# Patient Record
Sex: Male | Born: 1975 | Race: Black or African American | Hispanic: No | Marital: Married | Smoking: Never smoker
Health system: Southern US, Community
[De-identification: ages and names within clinical notes are randomized; demographics above are authoritative.]

## PROBLEM LIST (undated history)

## (undated) DIAGNOSIS — I10 Essential (primary) hypertension: Secondary | ICD-10-CM

## (undated) HISTORY — DX: Essential (primary) hypertension: I10

---

## 2012-05-12 ENCOUNTER — Telehealth: Payer: Self-pay | Admitting: Oncology

## 2012-05-12 ENCOUNTER — Encounter: Payer: Self-pay | Admitting: Oncology

## 2012-05-12 ENCOUNTER — Other Ambulatory Visit: Payer: Self-pay | Admitting: Oncology

## 2012-05-12 DIAGNOSIS — R790 Abnormal level of blood mineral: Secondary | ICD-10-CM

## 2012-05-12 NOTE — Telephone Encounter (Signed)
S/W PT WIFE IN REF TO NP APPT. 06/19/12@10 :30 REFERRING DR NNODI DX-R/O HEMACHROMATOSIS MAILED NP PACKET

## 2012-05-12 NOTE — Telephone Encounter (Signed)
C/D 05/12/12 for appt. 06/19/12

## 2012-06-16 NOTE — Patient Instructions (Signed)
1.  Issue:  Need to rule out hemachromatosis per primary care physician's request. 2.  Work up:  Blood test.  3.  If positive for hemachromatosis, treatment will be therapeutic phlebotomy to decrease iron load.  Goal is to decrease risk of end organ damage such as joint pain, heart failure, cirrhosis.

## 2012-06-19 ENCOUNTER — Ambulatory Visit: Payer: Self-pay

## 2012-06-19 ENCOUNTER — Other Ambulatory Visit: Payer: Self-pay | Admitting: Lab

## 2012-06-19 ENCOUNTER — Ambulatory Visit: Payer: Self-pay | Admitting: Oncology

## 2012-06-19 NOTE — Progress Notes (Signed)
No show.  Reminder letter sent.   

## 2012-10-02 ENCOUNTER — Other Ambulatory Visit: Payer: Self-pay

## 2012-10-02 ENCOUNTER — Other Ambulatory Visit: Payer: Self-pay | Admitting: Family Medicine

## 2012-10-02 DIAGNOSIS — M549 Dorsalgia, unspecified: Secondary | ICD-10-CM

## 2012-10-03 ENCOUNTER — Ambulatory Visit
Admission: RE | Admit: 2012-10-03 | Discharge: 2012-10-03 | Disposition: A | Discharge status: Home / Self Care | Payer: Managed Care, Other (non HMO) | Source: Ambulatory Visit | Attending: Family Medicine | Admitting: Family Medicine

## 2012-10-03 DIAGNOSIS — M549 Dorsalgia, unspecified: Secondary | ICD-10-CM

## 2013-04-22 ENCOUNTER — Ambulatory Visit (HOSPITAL_BASED_OUTPATIENT_CLINIC_OR_DEPARTMENT_OTHER)
Admission: RE | Admit: 2013-04-22 | Discharge: 2013-04-22 | Disposition: A | Discharge status: Home / Self Care | Payer: Managed Care, Other (non HMO) | Source: Ambulatory Visit | Attending: Family Medicine | Admitting: Family Medicine

## 2013-04-22 ENCOUNTER — Other Ambulatory Visit (HOSPITAL_BASED_OUTPATIENT_CLINIC_OR_DEPARTMENT_OTHER): Payer: Self-pay | Admitting: Family Medicine

## 2013-04-22 DIAGNOSIS — M25569 Pain in unspecified knee: Secondary | ICD-10-CM | POA: Insufficient documentation

## 2013-04-22 DIAGNOSIS — R52 Pain, unspecified: Secondary | ICD-10-CM

## 2013-09-17 ENCOUNTER — Ambulatory Visit
Admission: RE | Admit: 2013-09-17 | Discharge: 2013-09-17 | Disposition: A | Discharge status: Home / Self Care | Payer: 59 | Source: Ambulatory Visit | Attending: Family Medicine | Admitting: Family Medicine

## 2013-09-17 ENCOUNTER — Other Ambulatory Visit: Payer: Self-pay | Admitting: Family Medicine

## 2013-09-17 DIAGNOSIS — R079 Chest pain, unspecified: Secondary | ICD-10-CM

## 2014-06-18 ENCOUNTER — Other Ambulatory Visit: Payer: Self-pay | Admitting: Family Medicine

## 2014-06-18 DIAGNOSIS — N5089 Other specified disorders of the male genital organs: Secondary | ICD-10-CM

## 2014-06-21 ENCOUNTER — Ambulatory Visit
Admission: RE | Admit: 2014-06-21 | Discharge: 2014-06-21 | Disposition: A | Discharge status: Home / Self Care | Payer: Managed Care, Other (non HMO) | Source: Ambulatory Visit | Attending: Family Medicine | Admitting: Family Medicine

## 2014-06-21 DIAGNOSIS — N5089 Other specified disorders of the male genital organs: Secondary | ICD-10-CM

## 2015-12-23 IMAGING — CR DG CHEST 2V
2 series · 2 of 2 positions shown · non-contrast
Comparison: None.

CLINICAL DATA: Chest pain.

EXAM:
CHEST  2 VIEW

[view not recorded (1 of 2)]
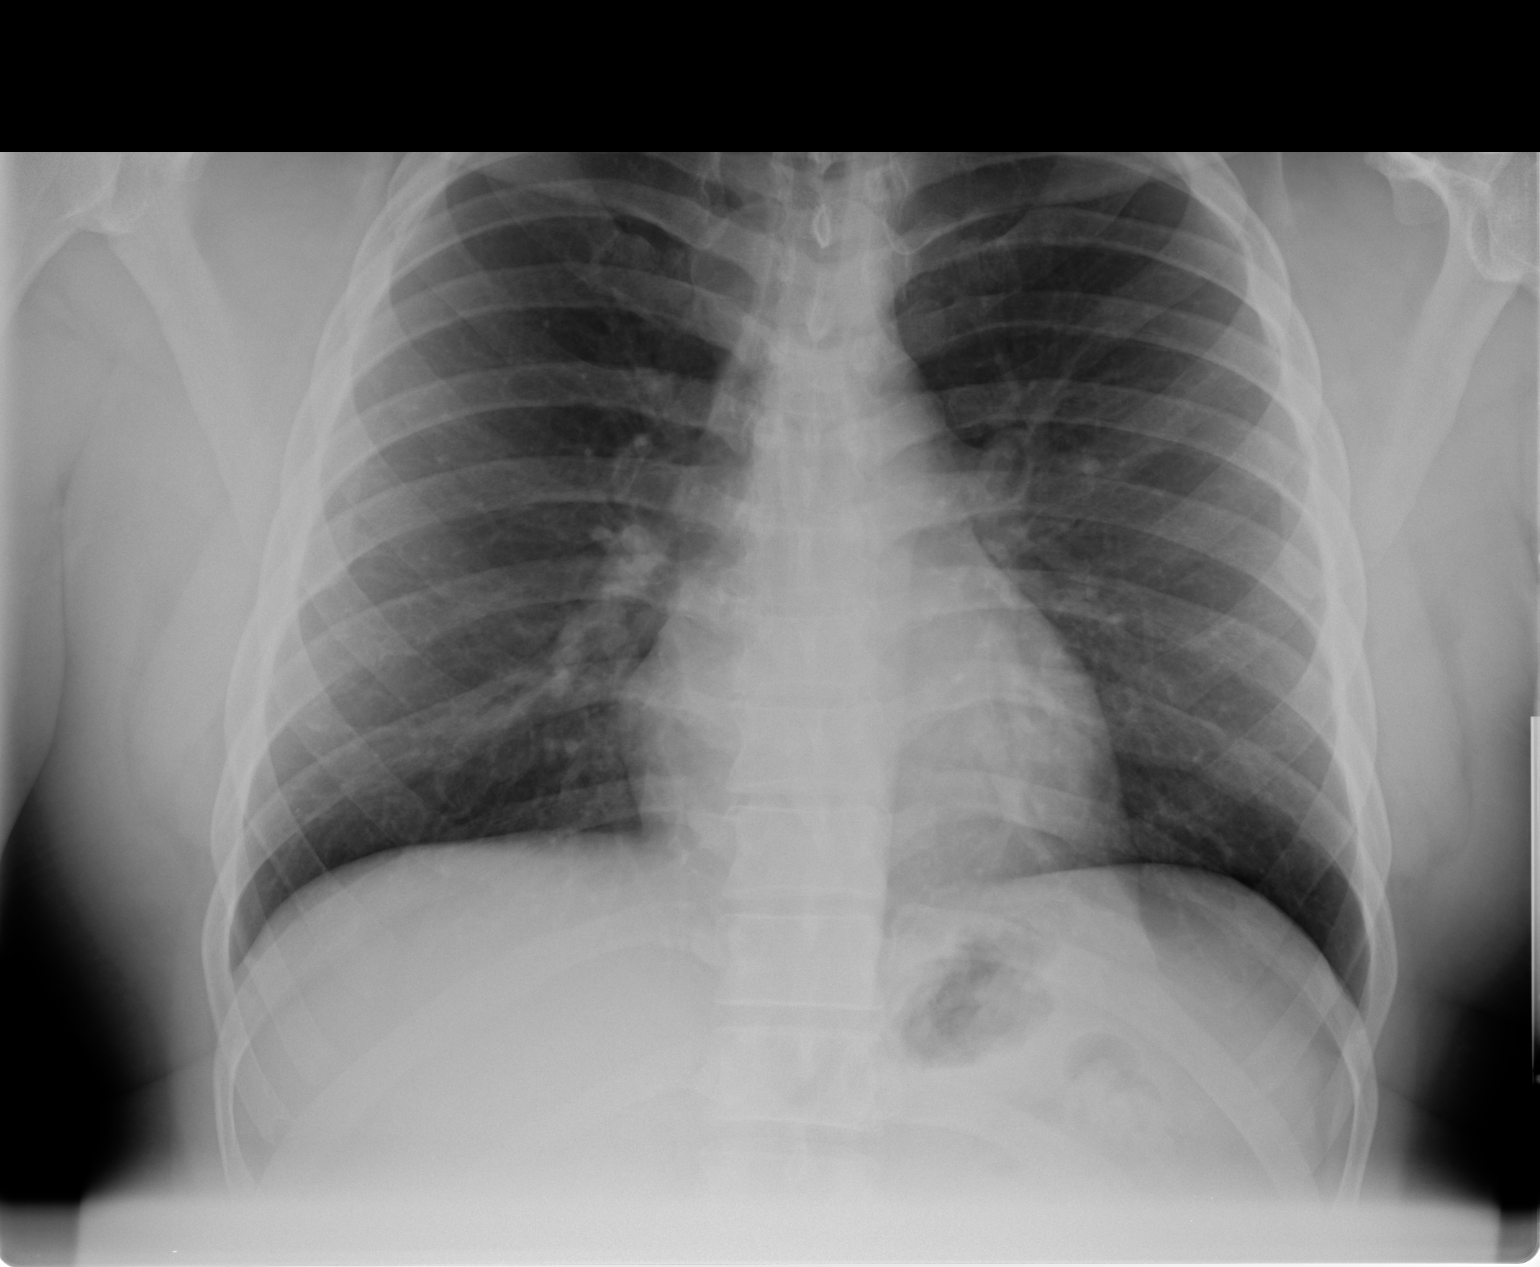

[view not recorded (2 of 2)]
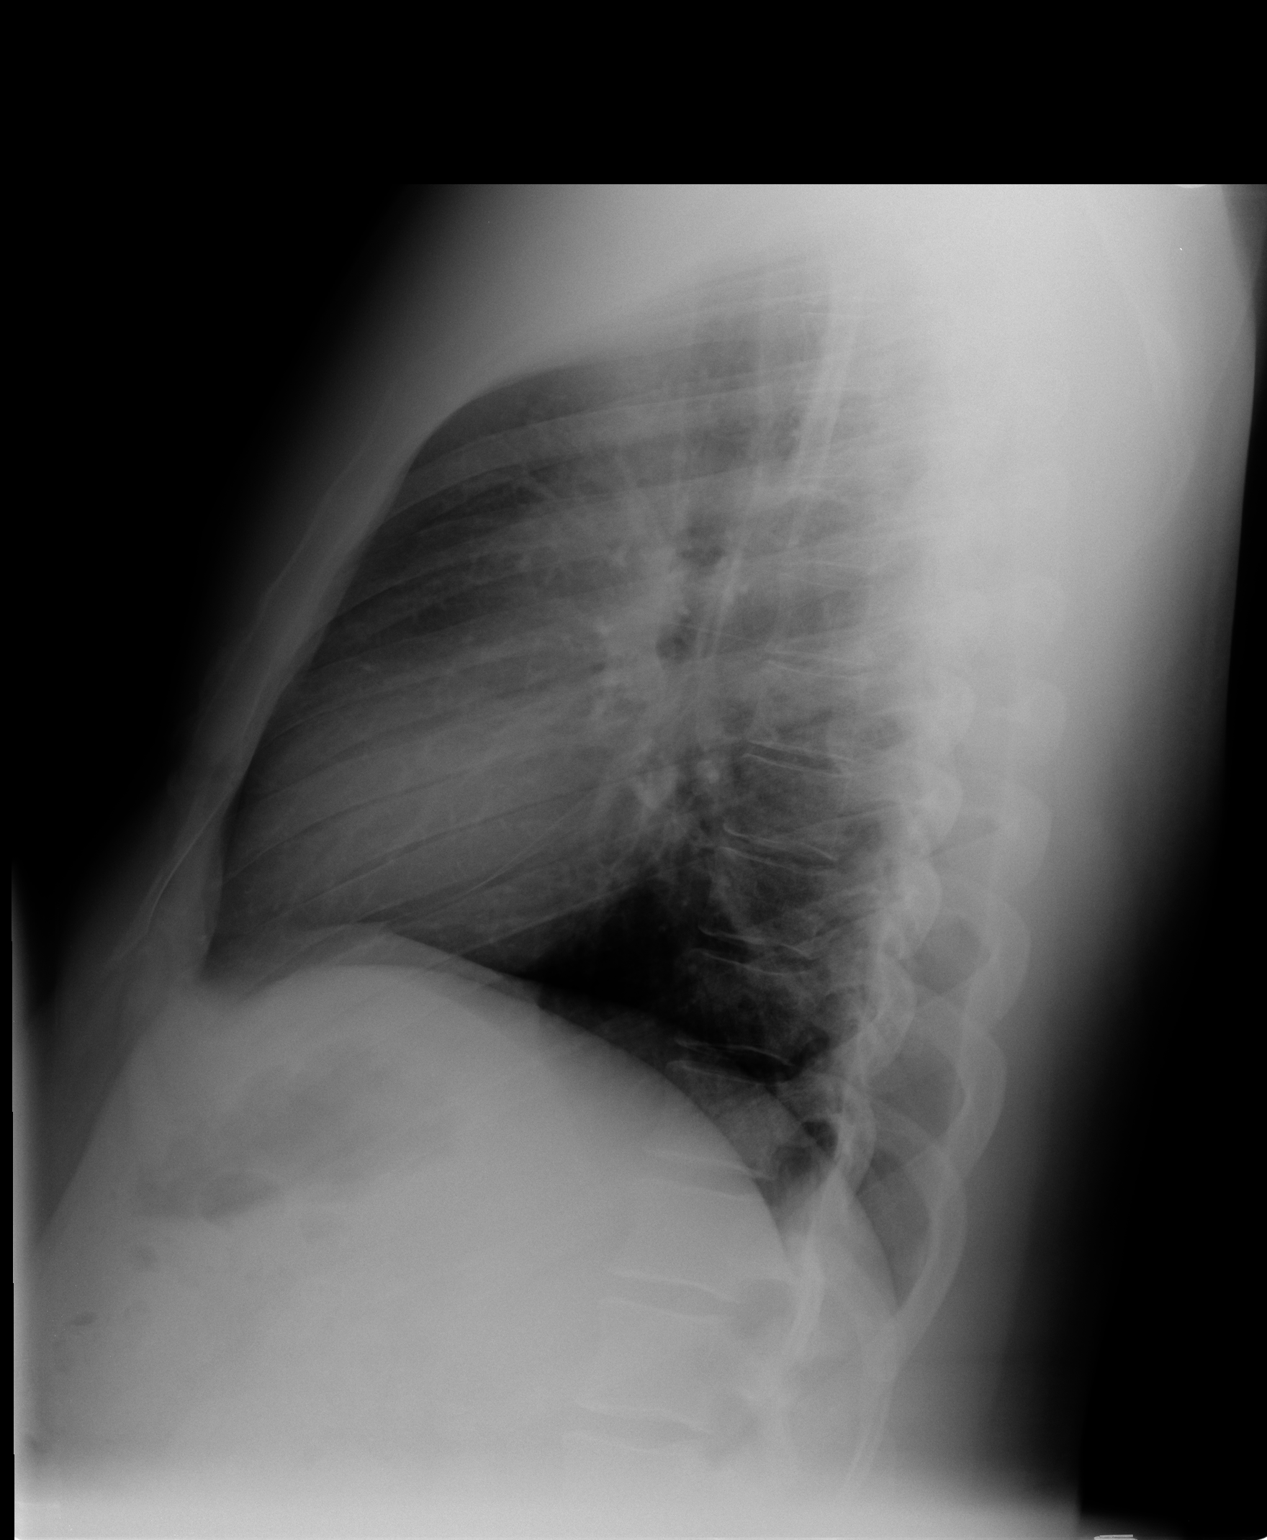

[2 of 2 positions shown; findings below may reference images not displayed]

FINDINGS: Trachea is midline. Heart size normal. Lungs are clear. No pleural
fluid.
IMPRESSION: No acute findings.

## 2016-05-20 ENCOUNTER — Emergency Department (HOSPITAL_BASED_OUTPATIENT_CLINIC_OR_DEPARTMENT_OTHER)
Admission: EM | Admit: 2016-05-20 | Discharge: 2016-05-20 | Disposition: A | Discharge status: Home / Self Care | Payer: 59 | Attending: Emergency Medicine | Admitting: Emergency Medicine

## 2016-05-20 ENCOUNTER — Encounter (HOSPITAL_BASED_OUTPATIENT_CLINIC_OR_DEPARTMENT_OTHER): Payer: Self-pay | Admitting: *Deleted

## 2016-05-20 DIAGNOSIS — I10 Essential (primary) hypertension: Secondary | ICD-10-CM | POA: Diagnosis not present

## 2016-05-20 DIAGNOSIS — R5383 Other fatigue: Secondary | ICD-10-CM | POA: Diagnosis not present

## 2016-05-20 DIAGNOSIS — R202 Paresthesia of skin: Secondary | ICD-10-CM | POA: Insufficient documentation

## 2016-05-20 DIAGNOSIS — R55 Syncope and collapse: Secondary | ICD-10-CM | POA: Insufficient documentation

## 2016-05-20 LAB — CBC WITH DIFFERENTIAL/PLATELET
Basophils Absolute: 0 10*3/uL (ref 0.0–0.1)
Basophils Relative: 1 %
Eosinophils Absolute: 0.1 10*3/uL (ref 0.0–0.7)
Eosinophils Relative: 1 %
HCT: 45.3 % (ref 39.0–52.0)
HEMOGLOBIN: 16 g/dL (ref 13.0–17.0)
LYMPHS ABS: 2.4 10*3/uL (ref 0.7–4.0)
Lymphocytes Relative: 43 %
MCH: 30.5 pg (ref 26.0–34.0)
MCHC: 35.3 g/dL (ref 30.0–36.0)
MCV: 86.5 fL (ref 78.0–100.0)
MONO ABS: 0.5 10*3/uL (ref 0.1–1.0)
MONOS PCT: 10 %
NEUTROS PCT: 45 %
Neutro Abs: 2.5 10*3/uL (ref 1.7–7.7)
Platelets: 224 10*3/uL (ref 150–400)
RBC: 5.24 MIL/uL (ref 4.22–5.81)
RDW: 13.3 % (ref 11.5–15.5)
WBC: 5.5 10*3/uL (ref 4.0–10.5)

## 2016-05-20 LAB — MAGNESIUM: MAGNESIUM: 2.2 mg/dL (ref 1.7–2.4)

## 2016-05-20 LAB — BASIC METABOLIC PANEL
Anion gap: 6 (ref 5–15)
BUN: 17 mg/dL (ref 6–20)
CHLORIDE: 103 mmol/L (ref 101–111)
CO2: 28 mmol/L (ref 22–32)
CREATININE: 1.29 mg/dL — AB (ref 0.61–1.24)
Calcium: 9.3 mg/dL (ref 8.9–10.3)
GFR calc Af Amer: >60 mL/min (ref >60)
GFR calc non Af Amer: >60 mL/min (ref >60)
Glucose, Bld: 101 mg/dL — ABNORMAL HIGH (ref 65–99)
Potassium: 4.1 mmol/L (ref 3.5–5.1)
SODIUM: 137 mmol/L (ref 135–145)

## 2016-05-20 MED ORDER — SODIUM CHLORIDE 0.9 % IV BOLUS (SEPSIS)
1000.0000 mL | Freq: Once | INTRAVENOUS | Status: AC
  Administered 2016-05-20: 1000 mL via INTRAVENOUS

## 2016-05-20 NOTE — ED Provider Notes (Signed)
[facility]   CSN: [id number] Arrival date & time: 05/20/16  [id number]     History   Chief Complaint Chief Complaint  Patient presents with  . Dizziness    HPI Cristian Harper is a 41 y.o. male.  41 yo M with a chief complaint of lightheadedness. Patient states that when he stands up suddenly bends over he feels like he may pass out. Been going on for the past couple weeks. He had a headache a couple days ago that resolved. Denies head injury denies loss of consciousness. Is describing bilateral hand and feet paresthesias. Denies any neck pain. Denies fevers or chills. Has been eating and drinking normally.  Old records reviewed: Patient had a similar presentation about 6 years ago and was seen at [facility] near [place]. At that time patient had a very extensive workup that was unremarkable. PCP visit note after that remarked they're concerned about him being overworked. Patient states that he is getting about 2 hours of sleep a night and has been going on for the past couple months.   The history is provided by the patient.  Dizziness  Quality:  Lightheadedness Severity:  Mild Onset quality:  Gradual Duration:  2 weeks Timing:  Intermittent Progression:  Waxing and waning Chronicity:  Recurrent Context: bending over and standing up   Relieved by:  Nothing Worsened by:  Nothing Ineffective treatments:  None tried Associated symptoms: no chest pain, no diarrhea, no headaches, no palpitations, no shortness of breath and no vomiting   Risk factors: no anemia and no Meniere's disease     Past Medical History:  Diagnosis Date  . HTN (hypertension)     There are no active problems to display for this patient.   History reviewed. No pertinent surgical history.     Home Medications    Prior to Admission medications   Medication Sig Start Date End Date Taking? Authorizing Provider  PRESCRIPTION MEDICATION BP medication, unsure of name.   Yes  Historical Provider, MD    Family History No family history on file.  Social History Social History  Substance Use Topics  . Smoking status: Never Smoker  . Smokeless tobacco: Never Used  . Alcohol use Yes     Comment: rarely     Allergies   Patient has no known allergies.   Review of Systems Review of Systems  Constitutional: Positive for fatigue. Negative for chills and fever.  HENT: Negative for congestion and facial swelling.   Eyes: Negative for discharge and visual disturbance.  Respiratory: Negative for shortness of breath.   Cardiovascular: Negative for chest pain and palpitations.  Gastrointestinal: Negative for abdominal pain, diarrhea and vomiting.  Musculoskeletal: Negative for arthralgias and myalgias.  Skin: Negative for color change and rash.  Neurological: Positive for dizziness. Negative for tremors, syncope and headaches.       Paresthesias  Psychiatric/Behavioral: Negative for confusion and dysphoric mood.     Physical Exam Updated Vital Signs BP 106/64 (BP Location: Right Arm)   Pulse (!) 58   Temp 98.1 F (36.7 C) (Oral)   Resp 16   Ht 5\' 8"  (1.727 m)   Wt 255 lb (115.7 kg)   SpO2 98%   BMI 38.77 kg/m   Physical Exam  Constitutional: He is oriented to person, place, and time. He appears well-developed and well-nourished.  HENT:  Head: Normocephalic and atraumatic.  Eyes: EOM are normal. Pupils are equal, round, and reactive to light.  Neck: Normal range of  motion. Neck supple. No JVD present.  Cardiovascular: Normal rate and regular rhythm.  Exam reveals no gallop and no friction rub.   No murmur heard. Pulmonary/Chest: No respiratory distress. He has no wheezes.  Abdominal: He exhibits no distension and no mass. There is no tenderness. There is no rebound and no guarding.  Musculoskeletal: Normal range of motion.  Neurological: He is alert and oriented to person, place, and time. He has normal strength. No cranial nerve deficit or  sensory deficit. He displays a negative Romberg sign. Coordination and gait normal. GCS eye subscore is 4. GCS verbal subscore is 5. GCS motor subscore is 6.  Reflex Scores:      Tricep reflexes are 2+ on the right side and 2+ on the left side.      Bicep reflexes are 2+ on the right side and 2+ on the left side.      Brachioradialis reflexes are 2+ on the right side and 2+ on the left side.      Patellar reflexes are 2+ on the right side and 2+ on the left side.      Achilles reflexes are 2+ on the right side and 2+ on the left side. Skin: No rash noted. No pallor.  Psychiatric: He has a normal mood and affect. His behavior is normal.  Nursing note and vitals reviewed.    ED Treatments / Results  Labs (all labs ordered are listed, but only abnormal results are displayed) Labs Reviewed  BASIC METABOLIC PANEL - Abnormal; Notable for the following:       Result Value   Glucose, Bld 101 (*)    Creatinine, Ser 1.29 (*)    All other components within normal limits  CBC WITH DIFFERENTIAL/PLATELET  MAGNESIUM    EKG  EKG Interpretation  Date/Time:  Sunday May 20 2016 10:36:15 EDT Ventricular Rate:  58 PR Interval:    QRS Duration: 87 QT Interval:  412 QTC Calculation: 405 R Axis:   70 Text Interpretation:  Sinus rhythm Borderline ST elevation, inferior leads isolated flipped t in III.  no wpw, prolonged qt, brugada Confirmed by Sharelle Burditt MD, DANIEL ([id number]) on 05/20/2016 10:41:37 AM Also confirmed by Lavalle Skoda MD, DANIEL ([id number]), editor Scales-Price, Shannon ([id number])  on 05/20/2016 11:01:50 AM       Radiology No results found.  Procedures Procedures (including critical care time)  Medications Ordered in ED Medications  sodium chloride 0.9 % bolus 1,000 mL (0 mLs Intravenous Stopped 05/20/16 1115)     Initial Impression / Assessment and Plan / ED Course  I have reviewed the triage vital signs and the nursing notes.  Pertinent labs & imaging results that were available during my  care of the patient were reviewed by me and considered in my medical decision making (see chart for details).     40  yo M With a chief complaint of lightheadedness as well as hand and foot paresthesias. Patient has a benign neurologic exam. I will check labs to evaluate for electrolyte abnormality. EKG because the patient was near syncopal couple days ago. I discussed with the patient that is likely due to his extreme lack of sleep for the past month or so. Recommended PCP follow-up for further workup.    I have discussed the diagnosis/risks/treatment options with the patient and family and believe the pt to be eligible for discharge home to follow-up with PCP. We also discussed returning to the ED immediately if new or worsening sx occur. We discussed the sx which  are most concerning (e.g., sudden worsening pain, fever, inability to tolerate by mouth) that necessitate immediate return. Medications administered to the patient during their visit and any new prescriptions provided to the patient are listed below.  Medications given during this visit Medications  sodium chloride 0.9 % bolus 1,000 mL (0 mLs Intravenous Stopped 05/20/16 1115)     The patient appears reasonably screen and/or stabilized for discharge and I doubt any other medical condition or other [facility] requiring further screening, evaluation, or treatment in the ED at this time prior to discharge.    Final Clinical Impressions(s) / ED Diagnoses   Final diagnoses:  Near syncope  Paresthesias    New Prescriptions Discharge Medication List as of 05/20/2016 11:09 AM       Melene Plan, DO 05/20/16 1445

## 2016-05-20 NOTE — Discharge Instructions (Signed)
Try to get more sleep.  Follow up with your family doc.

## 2016-05-20 NOTE — ED Triage Notes (Addendum)
Patient states he has a two week history of bilateral hand and feet tingling and numbness, light headedness and dizziness.  States his hands, feet and head feel heavy.  States over the symptoms have just intensified over the last two weeks.  Denies nausea or vomiting. Patient states he has been prescribed low dose of BP meds to take as needed, and is not taking on a regular basis.  Over the last few days is using some hemopathic medications to lower his bp.

## 2016-09-25 IMAGING — US US SCROTUM
1 series · 14 of 25 positions shown · non-contrast
Comparison: CT 10/03/2012.

CLINICAL DATA: Left testicular palpable lump.

EXAM:
ULTRASOUND OF SCROTUM
TECHNIQUE: Complete ultrasound examination of the testicles, epididymis, and
other scrotal structures was performed.

[Series 1: us scrotum · 0.08mm/px · 14 of 70 slices shown]
[im 1/70]
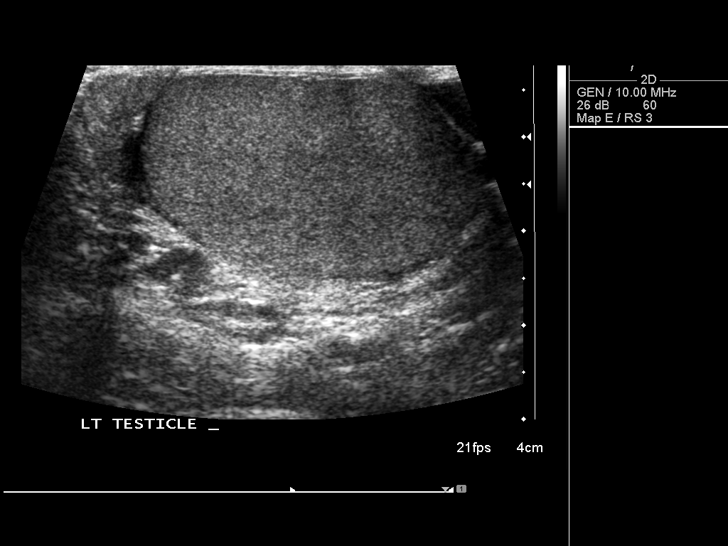
[im 6/70]
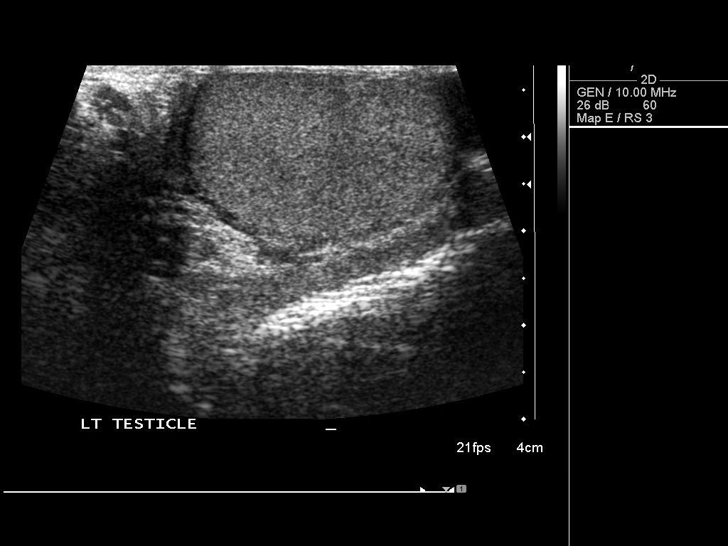
[im 12/70]
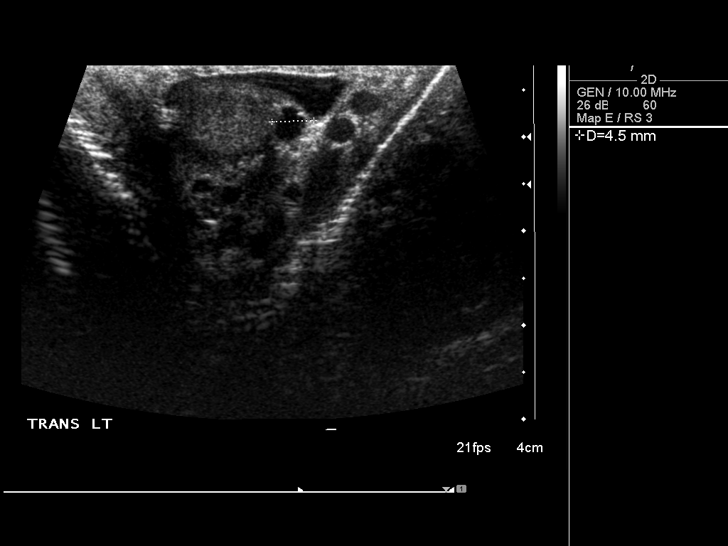
[im 18/70]
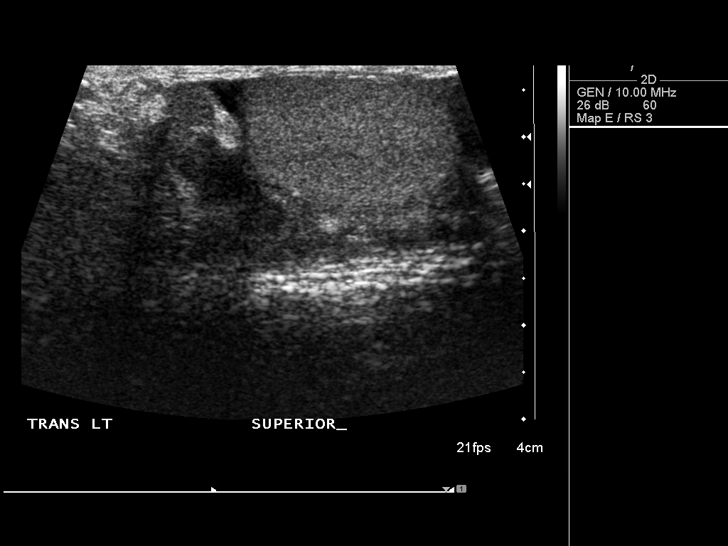
[im 24/70]
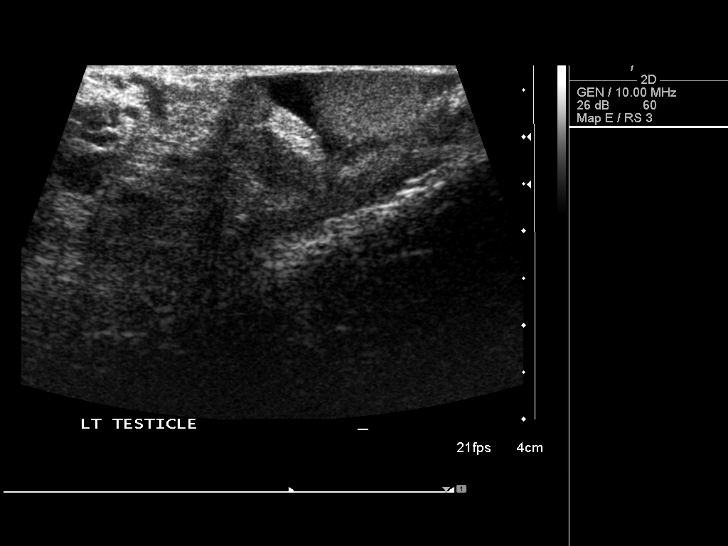
[im 26/70]
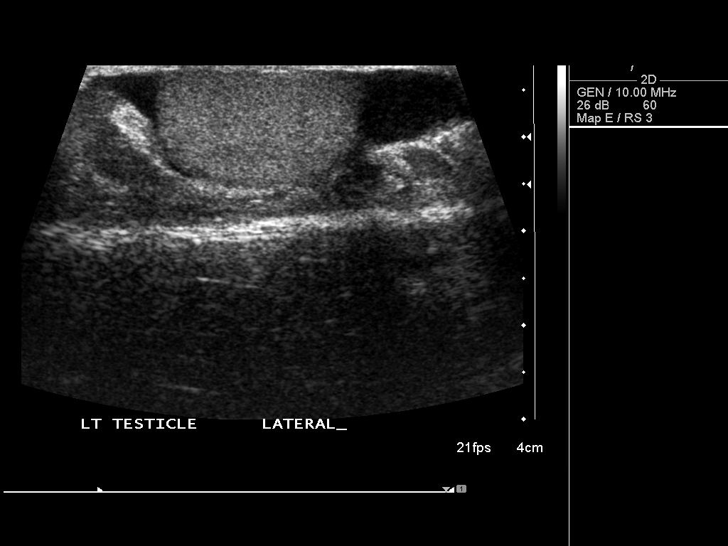
[im 32/70]
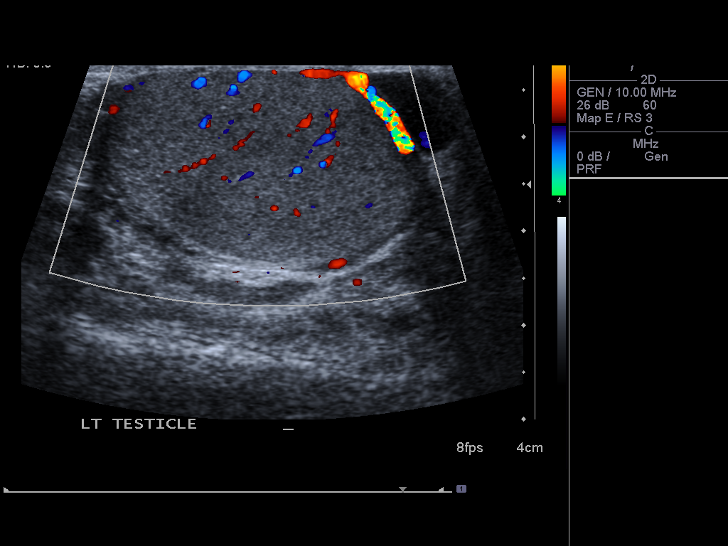
[im 38/70]
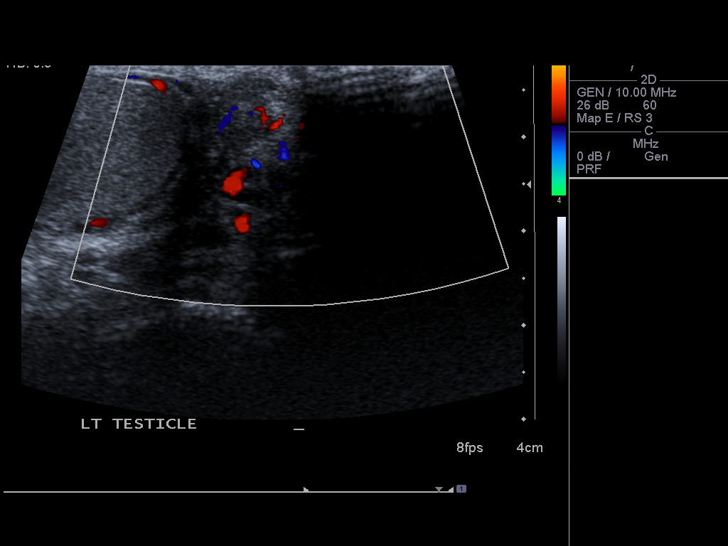
[im 44/70]
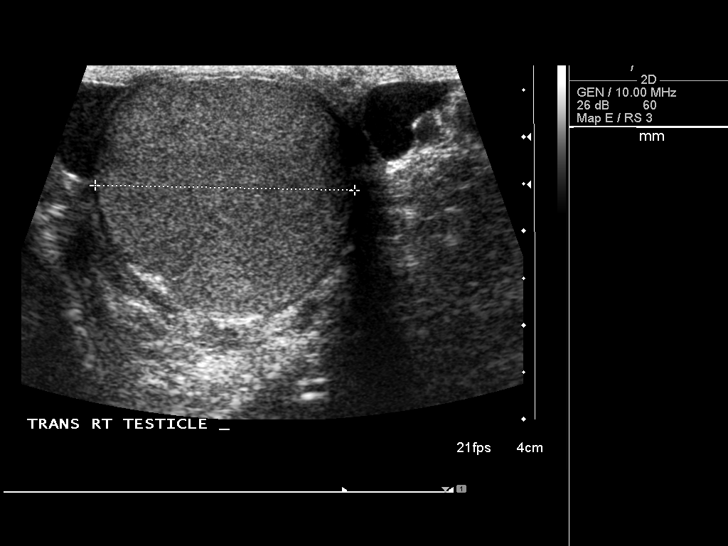
[im 47/70]
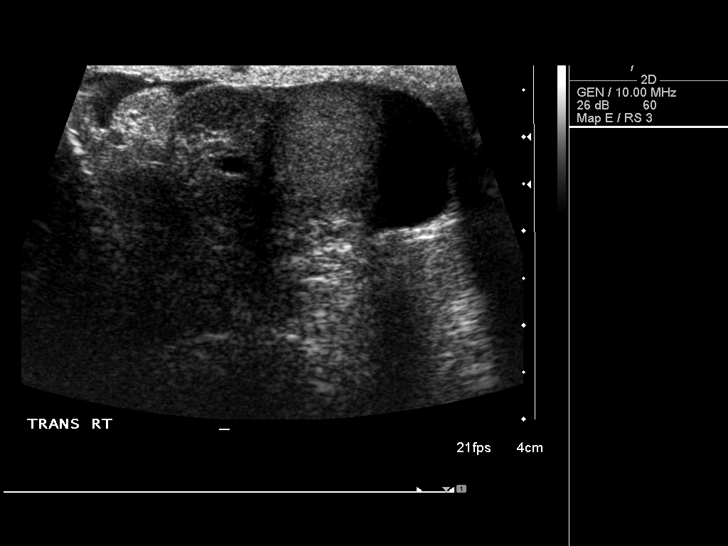
[im 52/70]
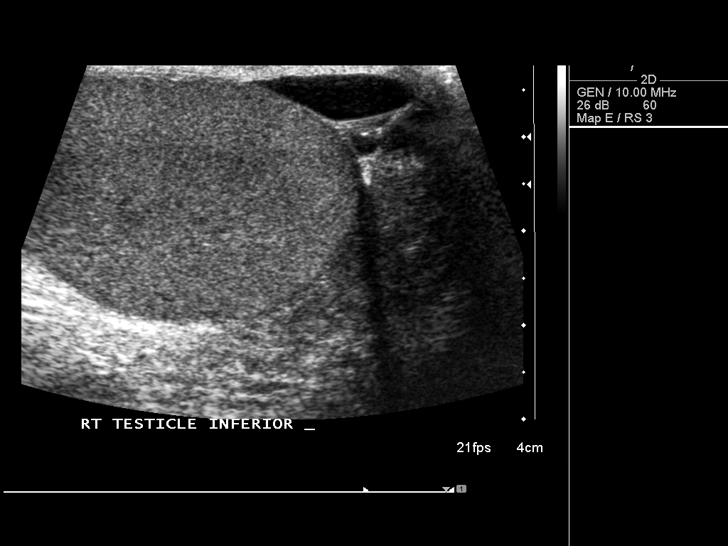
[im 58/70]
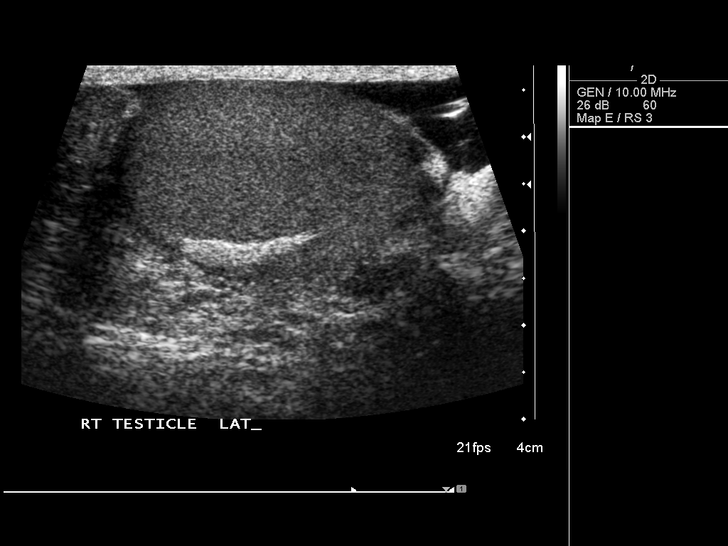
[im 64/70]
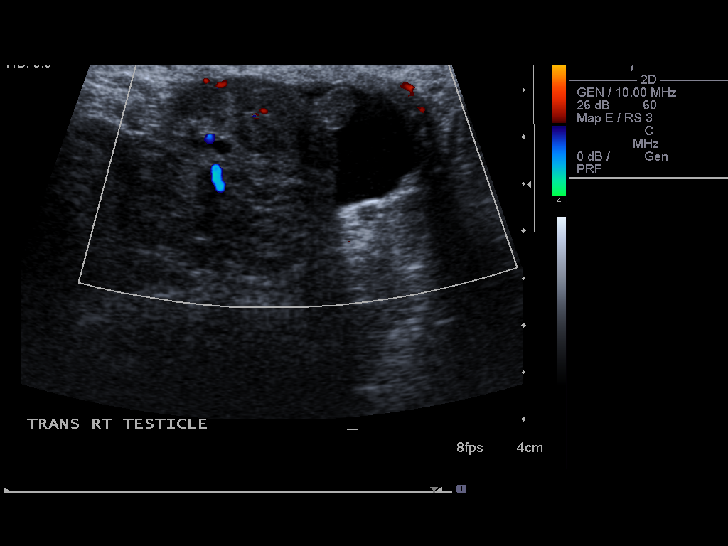
[im 70/70]
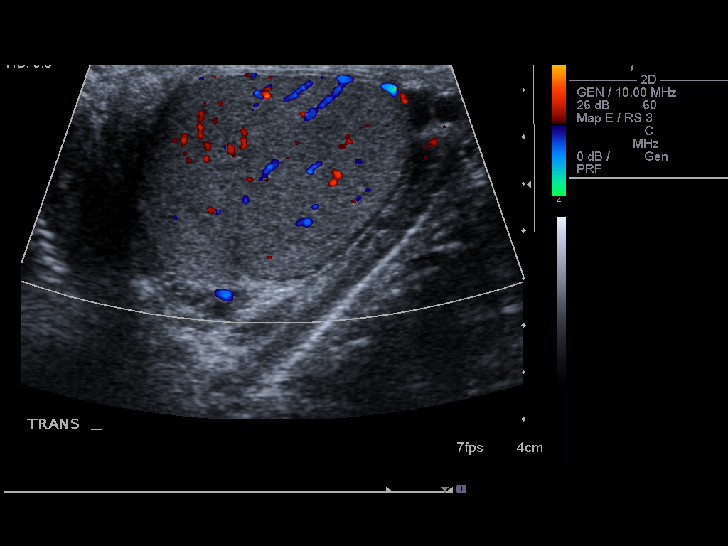

[14 of 25 positions shown; findings below may reference images not displayed]

FINDINGS: Right testicle

Measurements: 3.5 x 2.1 x 2.6 cm. No mass or microlithiasis
visualized.

Left testicle

Measurements: 4.1 x 2.6 x 2.8 cm. No mass or microlithiasis
visualized.

Right epididymis:  Normal in size and appearance.

Left epididymis:  Normal in size and appearance.

Hydrocele:  Small bilateral hydroceles.

Varicocele: Tiny left varicocele noted along the inferior aspect of
the testicle. This could represent palpable abnormality.
IMPRESSION: Tiny left varicocele noted along the inferior aspect of the
testicle. This could represent palpable abnormality. No evidence of
testicular mass or torsion.

## 2020-07-11 ENCOUNTER — Encounter (HOSPITAL_BASED_OUTPATIENT_CLINIC_OR_DEPARTMENT_OTHER): Payer: Self-pay | Admitting: Emergency Medicine

## 2020-07-11 ENCOUNTER — Emergency Department (HOSPITAL_BASED_OUTPATIENT_CLINIC_OR_DEPARTMENT_OTHER)
Admission: EM | Admit: 2020-07-11 | Discharge: 2020-07-11 | Disposition: A | Discharge status: Home / Self Care | Payer: 59 | Attending: Emergency Medicine | Admitting: Emergency Medicine

## 2020-07-11 ENCOUNTER — Other Ambulatory Visit: Payer: Self-pay

## 2020-07-11 ENCOUNTER — Emergency Department (HOSPITAL_BASED_OUTPATIENT_CLINIC_OR_DEPARTMENT_OTHER): Payer: 59

## 2020-07-11 DIAGNOSIS — M79651 Pain in right thigh: Secondary | ICD-10-CM | POA: Insufficient documentation

## 2020-07-11 DIAGNOSIS — M79604 Pain in right leg: Secondary | ICD-10-CM

## 2020-07-11 DIAGNOSIS — I10 Essential (primary) hypertension: Secondary | ICD-10-CM | POA: Insufficient documentation

## 2020-07-11 NOTE — ED Triage Notes (Signed)
Pt complains of intermittent pain to his right leg for the past few weeks.Pt currently denis pain.

## 2020-07-11 NOTE — Discharge Instructions (Addendum)
Your ultrasound did not show signs of a blood clot in your right leg.  Your pain may be related to a muscle strain or even sciatica, a pulled or pinched nerve in your lower back.  You can continue taking ibuprofen at home as needed for pain (over the counter dosage is fine, with advil, motrin, ibuprofen, etc).  Schedule follow-up appointment with your primary care provider in 1-2 weeks if your symptoms haven't improved.

## 2020-07-11 NOTE — ED Provider Notes (Signed)
[facility] EMERGENCY DEPT Provider Note   CSN: [id number] Arrival date & time: 07/11/20  1002     History Chief Complaint  Patient presents with  . Leg Pain    Cristian Harper is a 45 y.o. male with a history of hypertension presented emergency department with pain in his right upper leg for the past few weeks.  He reports his pain began after returning from a long drive to [place] to visit his mother.  It began while he was sitting at work.  He describes an intermittent throbbing pain in his right posterior thigh.  It tends to come and go.  He was concerned that it may be a blood clot from a conversation that he had with family friends, although he states he has never had a clot before.  He denies any family history of clotting that he is aware of.  He currently has very minimal symptoms.  He denies any chest pain, shortness of breath, lightheadedness.  No hemoptysis or asymmetric LE edema. Patient denies personal or family history of DVT or PE. No recent hormone use (including OCP); travel for >6 hours; surgeries or trauma in the last 4 weeks; or malignancy with treatment within 6 months.   HPI     Past Medical History:  Diagnosis Date  . HTN (hypertension)     There are no problems to display for this patient.   History reviewed. No pertinent surgical history.     History reviewed. No pertinent family history.  Social History   Tobacco Use  . Smoking status: Never Smoker  . Smokeless tobacco: Never Used  Substance Use Topics  . Alcohol use: Yes    Comment: rarely  . Drug use: No    Home Medications Prior to Admission medications   Medication Sig Start Date End Date Taking? Authorizing Provider  PRESCRIPTION MEDICATION BP medication, unsure of name.    [provider]    Allergies    Patient has no known allergies.  Review of Systems   Review of Systems  Constitutional: Negative for chills and fever.  Eyes: Negative for visual  disturbance.  Respiratory: Negative for chest tightness and shortness of breath.   Cardiovascular: Negative for chest pain and palpitations.  Gastrointestinal: Negative for abdominal pain and vomiting.  Musculoskeletal: Positive for myalgias. Negative for arthralgias and back pain.  Skin: Negative for color change and rash.  Neurological: Negative for weakness and numbness.  All other systems reviewed and are negative.   Physical Exam Updated Vital Signs BP 135/73 (BP Location: Right Arm)   Pulse 70   Temp 97.8 F (36.6 C) (Oral)   Resp 18   Ht 5\' 9"  (1.753 m)   Wt 117.9 kg   SpO2 96%   BMI 38.40 kg/m   Physical Exam Constitutional:      General: He is not in acute distress. HENT:     Head: Normocephalic and atraumatic.  Eyes:     Conjunctiva/sclera: Conjunctivae normal.     Pupils: Pupils are equal, round, and reactive to light.  Cardiovascular:     Rate and Rhythm: Normal rate and regular rhythm.     Pulses: Normal pulses.  Pulmonary:     Effort: Pulmonary effort is normal. No respiratory distress.  Abdominal:     General: There is no distension.     Tenderness: There is no abdominal tenderness.  Musculoskeletal:        General: No swelling, tenderness or deformity.  Skin:  General: Skin is warm and dry.  Neurological:     General: No focal deficit present.     Mental Status: He is alert. Mental status is at baseline.     Comments: Negative straight leg test  Psychiatric:        Mood and Affect: Mood normal.        Behavior: Behavior normal.     ED Results / Procedures / Treatments   Labs (all labs ordered are listed, but only abnormal results are displayed) Labs Reviewed - No data to display  EKG None  Radiology US Venous Img Lower Unilateral Right  Result Date: 07/11/2020 CLINICAL DATA:  Right posterior leg pain for the past several weeks after a long car ride. Evaluate for DVT. EXAM: RIGHT LOWER EXTREMITY VENOUS DOPPLER ULTRASOUND TECHNIQUE:  Gray-scale sonography with graded compression, as well as color Doppler and duplex ultrasound were performed to evaluate the lower extremity deep venous systems from the level of the common femoral vein and including the common femoral, femoral, profunda femoral, popliteal and calf veins including the posterior tibial, peroneal and gastrocnemius veins when visible. The superficial great saphenous vein was also interrogated. Spectral Doppler was utilized to evaluate flow at rest and with distal augmentation maneuvers in the common femoral, femoral and popliteal veins. COMPARISON:  Right lower extremity venous Doppler ultrasound-04/22/2013 FINDINGS: Contralateral Common Femoral Vein: Respiratory phasicity is normal and symmetric with the symptomatic side. No evidence of thrombus. Normal compressibility. Common Femoral Vein: No evidence of thrombus. Normal compressibility, respiratory phasicity and response to augmentation. Saphenofemoral Junction: No evidence of thrombus. Normal compressibility and flow on color Doppler imaging. Profunda Femoral Vein: No evidence of thrombus. Normal compressibility and flow on color Doppler imaging. Femoral Vein: No evidence of thrombus. Normal compressibility, respiratory phasicity and response to augmentation. Popliteal Vein: No evidence of thrombus. Normal compressibility, respiratory phasicity and response to augmentation. Calf Veins: No evidence of thrombus. Normal compressibility and flow on color Doppler imaging. Superficial Great Saphenous Vein: No evidence of thrombus. Normal compressibility. Venous Reflux:  None. Other Findings:  None. IMPRESSION: No evidence of DVT within the right lower extremity. Electronically Signed   By: Simonne Come M.D.   On: 07/11/2020 11:13    Procedures Procedures   Medications Ordered in ED Medications - No data to display  ED Course  I have reviewed the triage vital signs and the nursing notes.  Pertinent labs & imaging results that  were available during my care of the patient were reviewed by me and considered in my medical decision making (see chart for details).  Right upper leg pain which is posterior for the past several weeks.  Differential includes DVT versus muscle strain versus other.  Less likely sciatica with a negative straight leg test, although this does not exclude this completely.  We will obtain a DVT ultrasound while he is here.  No signs or symptoms of PE at this time.  Doubt cauda equina Clinical Course as of 07/11/20 1756  Mon Jul 11, 2020  1145 DVT ultrasound is negative.  I reassessed the patient, who continues to have minimal symptoms.  I discussed PCP follow-up.  Okay for discharge.  His wife was present for discharge instructions  [MT]    Clinical Course User Index [MT] Tristen Pennino, Kermit Balo, MD    Final Clinical Impression(s) / ED Diagnoses Final diagnoses:  Pain of right lower extremity    Rx / DC Orders ED Discharge Orders    None  Terald Sleeper, MD 07/11/20 [phone]

## 2022-02-19 ENCOUNTER — Emergency Department (HOSPITAL_BASED_OUTPATIENT_CLINIC_OR_DEPARTMENT_OTHER)
Admission: EM | Admit: 2022-02-19 | Discharge: 2022-02-19 | Disposition: A | Discharge status: Home / Self Care | Payer: Worker's Compensation | Attending: Emergency Medicine | Admitting: Emergency Medicine

## 2022-02-19 ENCOUNTER — Other Ambulatory Visit: Payer: Self-pay

## 2022-02-19 ENCOUNTER — Encounter (HOSPITAL_BASED_OUTPATIENT_CLINIC_OR_DEPARTMENT_OTHER): Payer: Self-pay

## 2022-02-19 ENCOUNTER — Emergency Department (HOSPITAL_BASED_OUTPATIENT_CLINIC_OR_DEPARTMENT_OTHER): Payer: Worker's Compensation

## 2022-02-19 DIAGNOSIS — Y990 Civilian activity done for income or pay: Secondary | ICD-10-CM | POA: Insufficient documentation

## 2022-02-19 DIAGNOSIS — M79604 Pain in right leg: Secondary | ICD-10-CM | POA: Diagnosis present

## 2022-02-19 DIAGNOSIS — X509XXA Other and unspecified overexertion or strenuous movements or postures, initial encounter: Secondary | ICD-10-CM | POA: Insufficient documentation

## 2022-02-19 DIAGNOSIS — S86911A Strain of unspecified muscle(s) and tendon(s) at lower leg level, right leg, initial encounter: Secondary | ICD-10-CM | POA: Diagnosis not present

## 2022-02-19 DIAGNOSIS — S86811A Strain of other muscle(s) and tendon(s) at lower leg level, right leg, initial encounter: Secondary | ICD-10-CM

## 2022-02-19 NOTE — Discharge Instructions (Signed)
Overall I suspect you have a calf strain or may be an Achilles tendon injury.  I recommend as little weightbearing as possible on your lower extremity and use crutches.  Recommend Tylenol, ibuprofen.  1000 mg of Tylenol every 6 hours.  Ibuprofen 400 mg every 8 hours.  Recommend ice and rest.

## 2022-02-19 NOTE — ED Notes (Signed)
Patient transported to X-ray 

## 2022-02-19 NOTE — ED Provider Notes (Signed)
[facility] EM[facility]GENCY DEPARTMENT Provider Note   CSN: [id number] Arrival date & time: 02/19/22  2005     History  Chief Complaint  Patient presents with   calf pain/"felt pop"    Cristian Harper is a 46 y.o. male.  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  He was stepping up and down from a step carrying an object/packages off of a train.  When he stepped down he heard a loud pop in his calf.  He has been having pain since and having hard time walking.  Denies any injury in the past.  Nothing makes it worse or better.  Denies fall did not hit his head or lose consciousness.  Denies any weakness or numbness or tingling.  The history is provided by the patient.       Home Medications Prior to Admission medications   Medication Sig Start Date End Date Taking? Authorizing Provider  PRESCRIPTION MEDICATION BP medication, unsure of name.    [provider]      Allergies    Patient has no known allergies.    Review of Systems   Review of Systems  Physical Exam Updated Vital Signs BP 137/73 (BP Location: Left Arm)   Pulse 66   Temp 97.8 F (36.6 C) (Oral)   Resp 18   Ht 5\' 9"  (1.753 m)   Wt 122.5 kg   SpO2 100%   BMI 39.87 kg/m  Physical Exam Vitals and nursing note reviewed.  Constitutional:      General: He is not in acute distress.    Appearance: He is well-developed. He is not ill-appearing.  HENT:     Head: Normocephalic and atraumatic.     Nose: Nose normal.     Mouth/Throat:     Mouth: Mucous membranes are moist.  Eyes:     Extraocular Movements: Extraocular movements intact.     Conjunctiva/sclera: Conjunctivae normal.     Pupils: Pupils are equal, round, and reactive to light.  Cardiovascular:     Rate and Rhythm: Normal rate and regular rhythm.     Pulses: Normal pulses.     Heart sounds: No murmur heard. Pulmonary:     Effort:  Pulmonary effort is normal. No respiratory distress.     Breath sounds: Normal breath sounds.  Abdominal:     Palpations: Abdomen is soft.     Tenderness: There is no abdominal tenderness.  Musculoskeletal:        General: Tenderness present. No swelling. Normal range of motion.     Cervical back: Normal range of motion and neck supple.     Comments: Tenderness within the right calf muscle, there is no obvious defect at the Achilles area on the right lower leg, he is able to flex and extend at the right ankle, he has a lot of discomfort when he plantarflexion specially within his calf  Skin:    General: Skin is warm and dry.     Capillary Refill: Capillary refill takes less than 2 seconds.  Neurological:     Mental Status: He is alert.     Sensory: No sensory deficit.     Motor: No weakness.  Psychiatric:        Mood and Affect: Mood normal.     ED Results / Procedures / Treatments   Labs (all labs ordered are listed, but only abnormal  results are displayed) Labs Reviewed - No data to display  EKG None  Radiology DG Ankle Complete Right  Result Date: 02/19/2022 CLINICAL DATA:  Right ankle pain. EXAM: RIGHT ANKLE - COMPLETE 3+ VIEW COMPARISON:  None Available. FINDINGS: There is no evidence of fracture, dislocation, or joint effusion. There is no evidence of arthropathy or other focal bone abnormality. Soft tissues are unremarkable. IMPRESSION: Negative. Electronically Signed   By: Anner Crete M.D.   On: 02/19/2022 21:28    Procedures Procedures    Medications Ordered in ED Medications - No data to display  ED Course/ Medical Decision Making/ A&P                           Medical Decision Making Amount and/or Complexity of Data Reviewed Radiology: ordered.   Cristian Harper is here with right calf pain.  Normal vitals.  No fever.  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  He was stepping up and down with his right leg unloading these packages when he  stepped down and heard a sudden pop in his lower leg.  Felt it mostly in his right calf.  He appears to have decent flexion extension I do not feel any palpable defect at his Achilles and I suspect that this is likely calf strain/tear rather than an Achilles tendon rupture but could be a partial Achilles tendon injury as well.  He is neurovascular neuromuscular intact otherwise.  X-ray of the ankle on the right shows no obvious fracture or dislocation.  There is no obvious avulsion injury may be less likely that this is Achilles.  Will have him be nonweightbearing with crutches and recommend Tylenol, ice, ibuprofen will have him follow-up with orthopedics.  Will write him off work.  No strenuous activities.  Recommend return if symptoms worsen.  Neurovascular neuromuscular intact otherwise.  This chart was dictated using voice recognition software.  Despite best efforts to proofread,  errors can occur which can change the documentation meaning.         Final Clinical Impression(s) / ED Diagnoses Final diagnoses:  Strain of calf muscle, right, initial encounter    Rx / DC Orders ED Discharge Orders     None         Lennice Sites, DO 02/19/22 2228

## 2022-02-19 NOTE — ED Triage Notes (Signed)
Patient arrives POV c/o right calf pain that started around 1900 this evening; pt was at work when he "felt his leg pop." Pt states he is unable to bear weight on right leg; reports no hx of DVT, does not take blood thinner. On inspection, no redness or heat is present. Pt reports right leg pain feels like a cramp.

## 2022-10-16 IMAGING — US US EXTREM LOW VENOUS*R*
1 series · 13 of 24 positions shown · non-contrast
Comparison: Right lower extremity venous Doppler
ultrasound-04/22/2013

CLINICAL DATA: Right posterior leg pain for the past several weeks
after a long car ride. Evaluate for DVT.



[Series 1: us venous img lower uni right (dvt) · portal-venous · 13 of 44 slices shown]
[im 1/44]
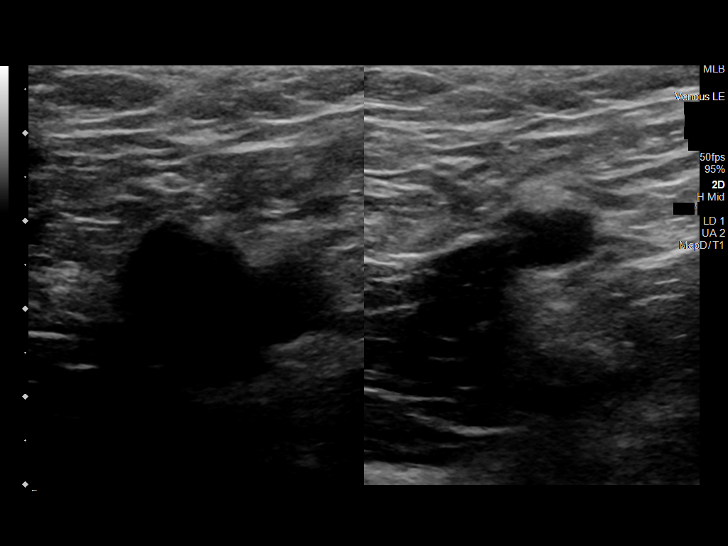
[im 4/44]
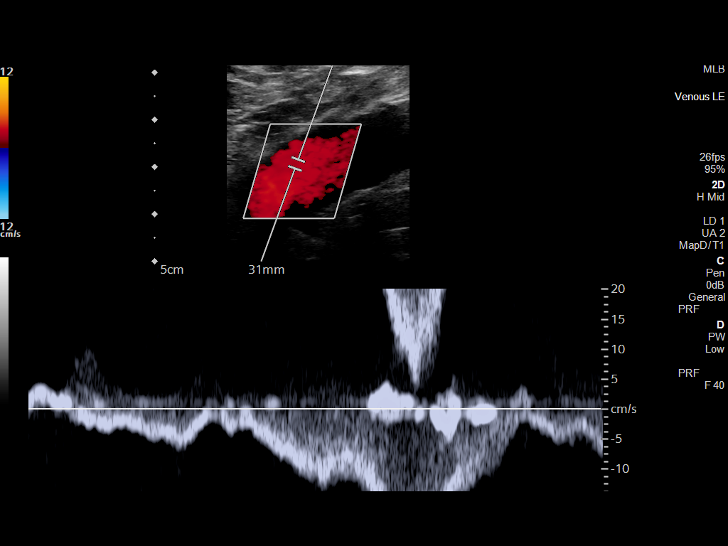
[im 8/44]
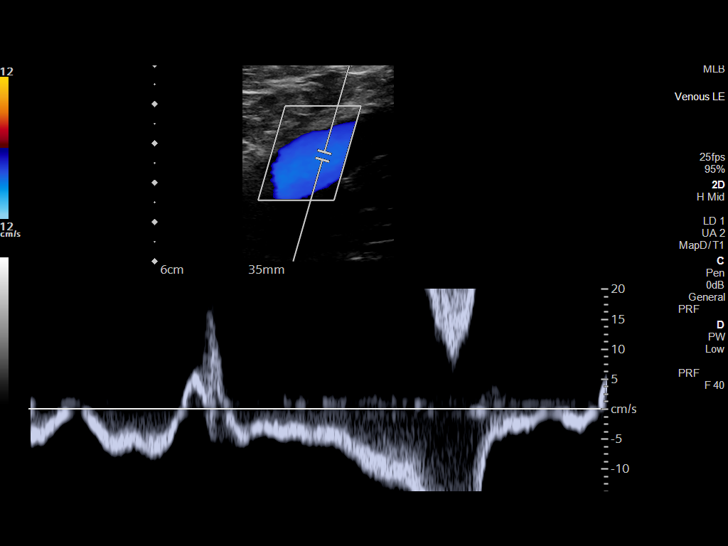
[im 12/44]
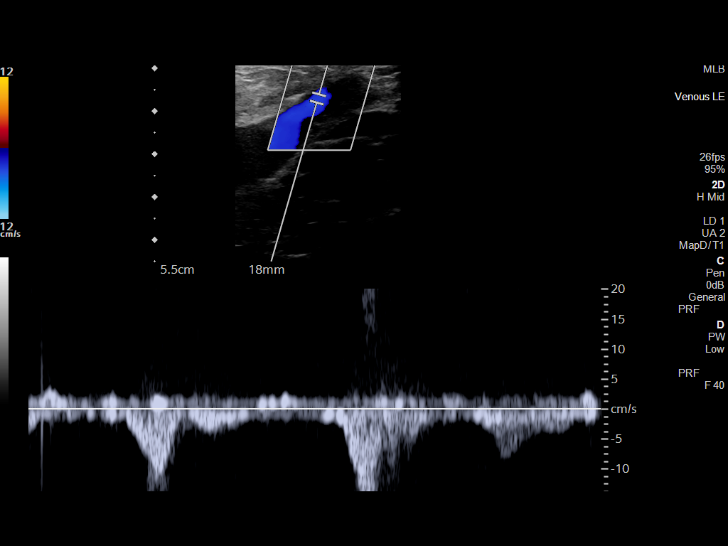
[im 15/44]
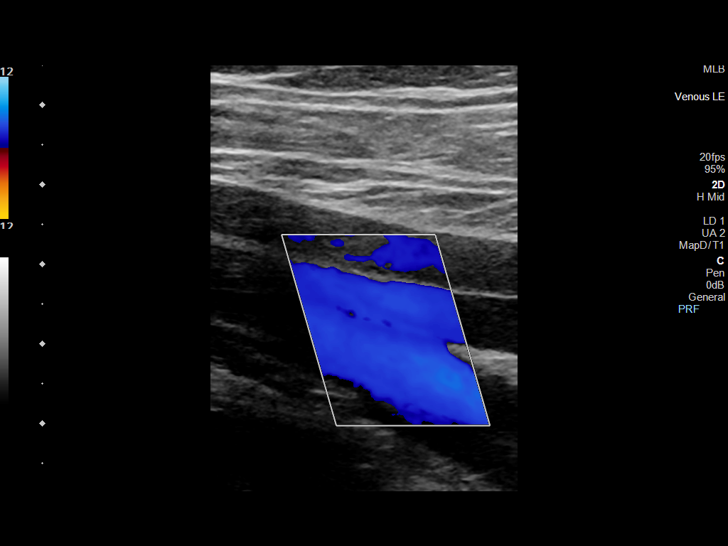
[im 19/44]
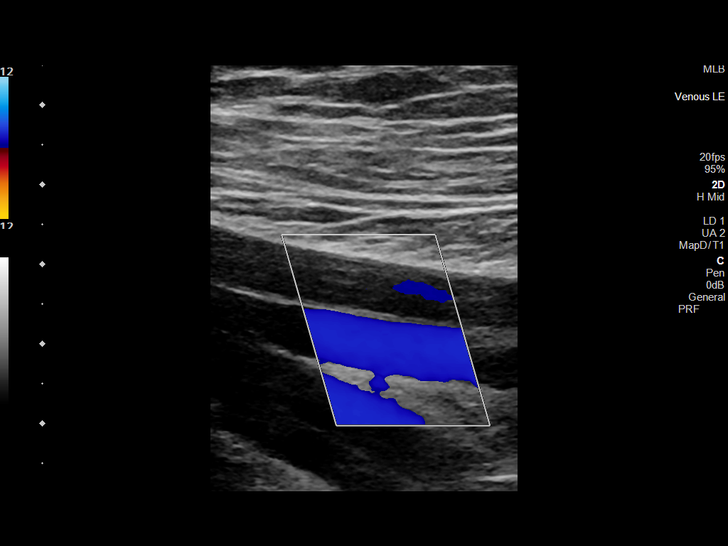
[im 23/44]
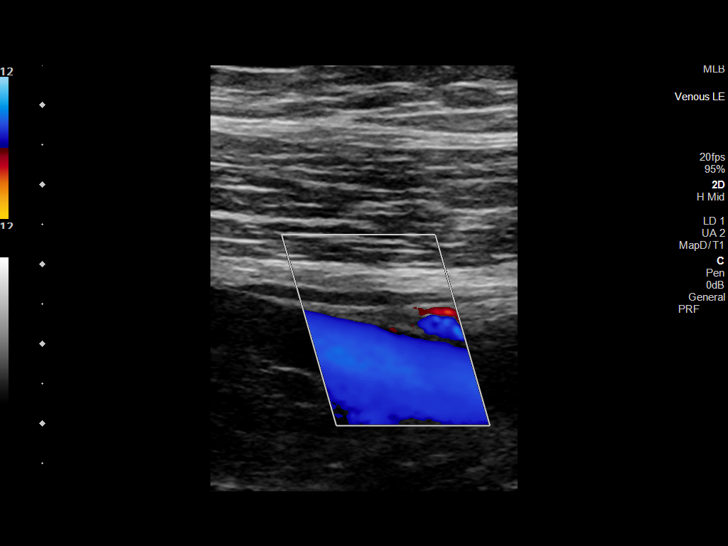
[im 25/44]
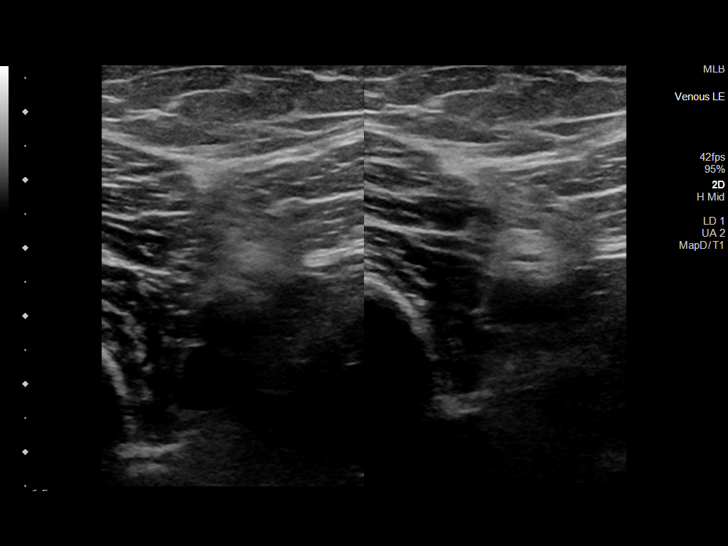
[im 29/44]
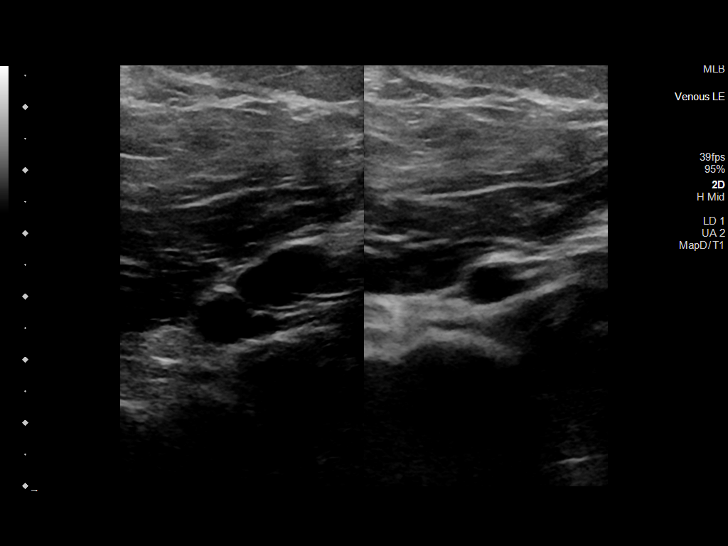
[im 32/44]
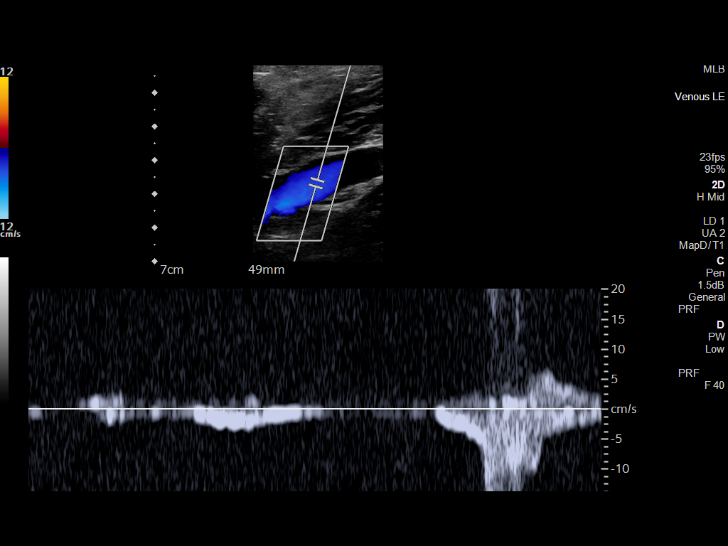
[im 36/44]
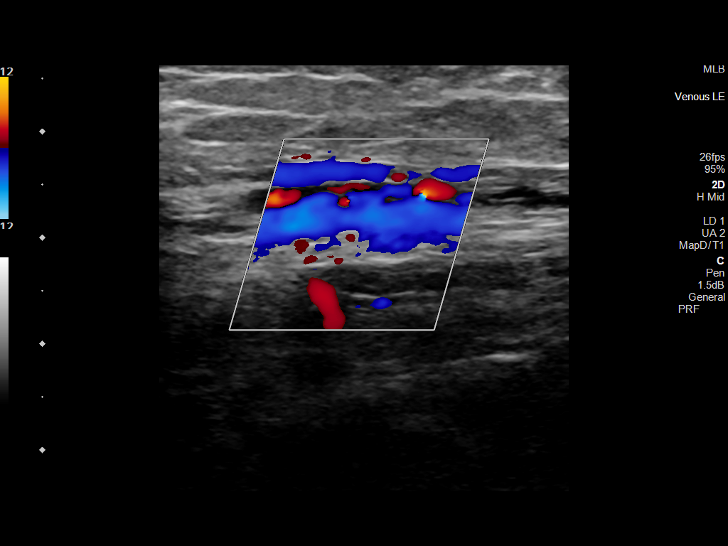
[im 40/44]
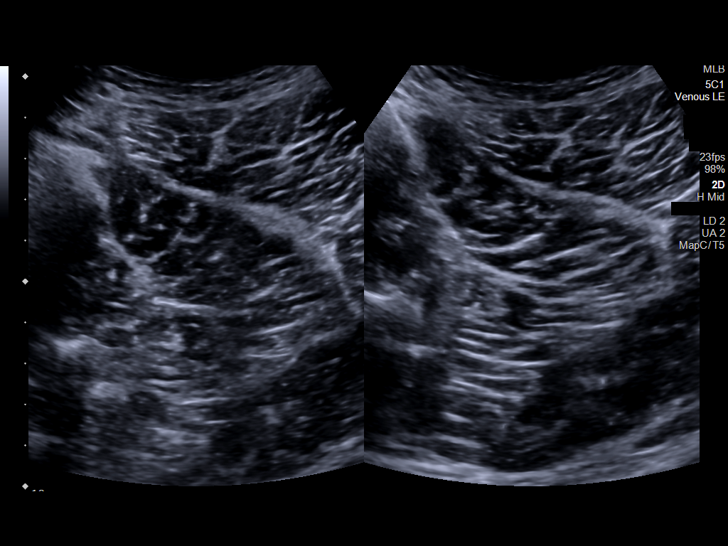
[im 44/44]
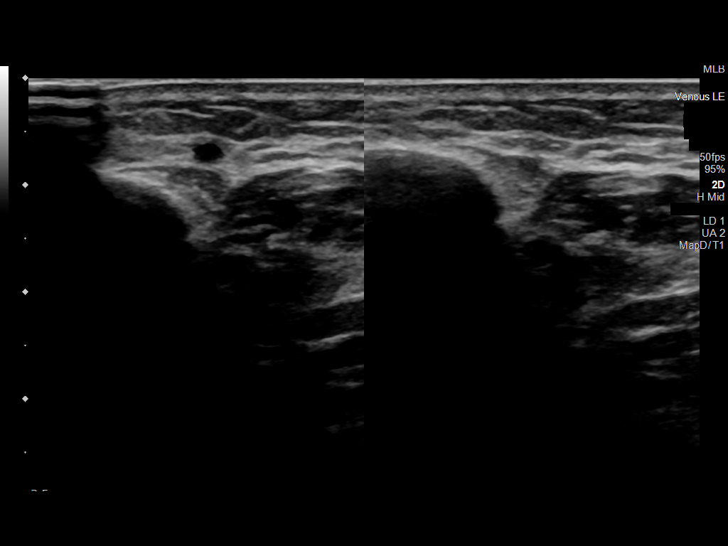

[13 of 24 positions shown; findings below may reference images not displayed]

FINDINGS: Contralateral Common Femoral Vein: Respiratory phasicity is normal
and symmetric with the symptomatic side. No evidence of thrombus.
Normal compressibility.

Common Femoral Vein: No evidence of thrombus. Normal
compressibility, respiratory phasicity and response to augmentation.

Saphenofemoral Junction: No evidence of thrombus. Normal
compressibility and flow on color Doppler imaging.

Profunda Femoral Vein: No evidence of thrombus. Normal
compressibility and flow on color Doppler imaging.

Femoral Vein: No evidence of thrombus. Normal compressibility,
respiratory phasicity and response to augmentation.

Popliteal Vein: No evidence of thrombus. Normal compressibility,
respiratory phasicity and response to augmentation.

Calf Veins: No evidence of thrombus. Normal compressibility and flow
on color Doppler imaging.

Superficial Great Saphenous Vein: No evidence of thrombus. Normal
compressibility.

Venous Reflux:  None.

Other Findings:  None.
IMPRESSION: No evidence of DVT within the right lower extremity.
